# Patient Record
Sex: Female | Born: 1995 | Race: Asian | Hispanic: No | Marital: Single | Smoking: Never smoker
Health system: Southern US, Community
[De-identification: ages and names within clinical notes are randomized; demographics above are authoritative.]

## PROBLEM LIST (undated history)

## (undated) DIAGNOSIS — R51 Headache: Secondary | ICD-10-CM

## (undated) DIAGNOSIS — IMO0002 Reserved for concepts with insufficient information to code with codable children: Secondary | ICD-10-CM

## (undated) DIAGNOSIS — R519 Headache, unspecified: Secondary | ICD-10-CM

## (undated) DIAGNOSIS — G43709 Chronic migraine without aura, not intractable, without status migrainosus: Secondary | ICD-10-CM

## (undated) DIAGNOSIS — J45909 Unspecified asthma, uncomplicated: Secondary | ICD-10-CM

## (undated) HISTORY — DX: Headache, unspecified: R51.9

## (undated) HISTORY — DX: Chronic migraine without aura, not intractable, without status migrainosus: G43.709

## (undated) HISTORY — DX: Unspecified asthma, uncomplicated: J45.909

## (undated) HISTORY — DX: Headache: R51

## (undated) HISTORY — DX: Reserved for concepts with insufficient information to code with codable children: IMO0002

## (undated) HISTORY — PX: NO PAST SURGERIES: SHX2092

---

## 2017-03-16 DIAGNOSIS — Z1389 Encounter for screening for other disorder: Secondary | ICD-10-CM | POA: Diagnosis not present

## 2017-03-16 DIAGNOSIS — R51 Headache: Secondary | ICD-10-CM | POA: Diagnosis not present

## 2017-03-16 DIAGNOSIS — Z020 Encounter for examination for admission to educational institution: Secondary | ICD-10-CM | POA: Diagnosis not present

## 2017-03-16 DIAGNOSIS — Z79899 Other long term (current) drug therapy: Secondary | ICD-10-CM | POA: Diagnosis not present

## 2018-01-13 DIAGNOSIS — R55 Syncope and collapse: Secondary | ICD-10-CM | POA: Diagnosis not present

## 2018-01-13 DIAGNOSIS — Z6826 Body mass index (BMI) 26.0-26.9, adult: Secondary | ICD-10-CM | POA: Diagnosis not present

## 2018-01-13 DIAGNOSIS — R42 Dizziness and giddiness: Secondary | ICD-10-CM | POA: Diagnosis not present

## 2018-01-13 DIAGNOSIS — G43109 Migraine with aura, not intractable, without status migrainosus: Secondary | ICD-10-CM | POA: Diagnosis not present

## 2018-01-13 DIAGNOSIS — R799 Abnormal finding of blood chemistry, unspecified: Secondary | ICD-10-CM | POA: Diagnosis not present

## 2018-01-14 ENCOUNTER — Other Ambulatory Visit: Payer: Self-pay | Admitting: Family

## 2018-01-14 DIAGNOSIS — R55 Syncope and collapse: Secondary | ICD-10-CM

## 2018-01-14 DIAGNOSIS — R42 Dizziness and giddiness: Secondary | ICD-10-CM

## 2018-01-14 DIAGNOSIS — G43109 Migraine with aura, not intractable, without status migrainosus: Secondary | ICD-10-CM

## 2018-01-15 ENCOUNTER — Ambulatory Visit
Admission: RE | Admit: 2018-01-15 | Discharge: 2018-01-15 | Disposition: A | Discharge status: Home / Self Care | Payer: BLUE CROSS/BLUE SHIELD | Source: Ambulatory Visit | Attending: Family | Admitting: Family

## 2018-01-15 DIAGNOSIS — R55 Syncope and collapse: Secondary | ICD-10-CM

## 2018-01-15 DIAGNOSIS — G43109 Migraine with aura, not intractable, without status migrainosus: Secondary | ICD-10-CM

## 2018-01-15 DIAGNOSIS — R42 Dizziness and giddiness: Secondary | ICD-10-CM | POA: Diagnosis not present

## 2018-01-22 DIAGNOSIS — R7989 Other specified abnormal findings of blood chemistry: Secondary | ICD-10-CM | POA: Diagnosis not present

## 2018-02-02 DIAGNOSIS — R5383 Other fatigue: Secondary | ICD-10-CM | POA: Diagnosis not present

## 2018-02-02 DIAGNOSIS — Z6825 Body mass index (BMI) 25.0-25.9, adult: Secondary | ICD-10-CM | POA: Diagnosis not present

## 2018-02-02 DIAGNOSIS — R531 Weakness: Secondary | ICD-10-CM | POA: Diagnosis not present

## 2018-02-02 DIAGNOSIS — G4452 New daily persistent headache (NDPH): Secondary | ICD-10-CM | POA: Diagnosis not present

## 2018-02-03 DIAGNOSIS — R5383 Other fatigue: Secondary | ICD-10-CM | POA: Diagnosis not present

## 2018-02-11 DIAGNOSIS — G43709 Chronic migraine without aura, not intractable, without status migrainosus: Secondary | ICD-10-CM | POA: Diagnosis not present

## 2018-02-11 DIAGNOSIS — Z049 Encounter for examination and observation for unspecified reason: Secondary | ICD-10-CM | POA: Diagnosis not present

## 2018-02-19 DIAGNOSIS — M791 Myalgia, unspecified site: Secondary | ICD-10-CM | POA: Diagnosis not present

## 2018-02-19 DIAGNOSIS — G518 Other disorders of facial nerve: Secondary | ICD-10-CM | POA: Diagnosis not present

## 2018-02-19 DIAGNOSIS — M542 Cervicalgia: Secondary | ICD-10-CM | POA: Diagnosis not present

## 2018-02-19 DIAGNOSIS — G43709 Chronic migraine without aura, not intractable, without status migrainosus: Secondary | ICD-10-CM | POA: Diagnosis not present

## 2018-03-19 DIAGNOSIS — G43709 Chronic migraine without aura, not intractable, without status migrainosus: Secondary | ICD-10-CM | POA: Diagnosis not present

## 2018-03-19 DIAGNOSIS — M542 Cervicalgia: Secondary | ICD-10-CM | POA: Diagnosis not present

## 2018-03-19 DIAGNOSIS — G518 Other disorders of facial nerve: Secondary | ICD-10-CM | POA: Diagnosis not present

## 2018-03-19 DIAGNOSIS — M791 Myalgia, unspecified site: Secondary | ICD-10-CM | POA: Diagnosis not present

## 2018-03-31 DIAGNOSIS — M542 Cervicalgia: Secondary | ICD-10-CM | POA: Diagnosis not present

## 2018-03-31 DIAGNOSIS — M791 Myalgia, unspecified site: Secondary | ICD-10-CM | POA: Diagnosis not present

## 2018-03-31 DIAGNOSIS — G518 Other disorders of facial nerve: Secondary | ICD-10-CM | POA: Diagnosis not present

## 2018-03-31 DIAGNOSIS — G43709 Chronic migraine without aura, not intractable, without status migrainosus: Secondary | ICD-10-CM | POA: Diagnosis not present

## 2018-04-08 DIAGNOSIS — J4520 Mild intermittent asthma, uncomplicated: Secondary | ICD-10-CM | POA: Diagnosis not present

## 2018-04-08 DIAGNOSIS — Z6824 Body mass index (BMI) 24.0-24.9, adult: Secondary | ICD-10-CM | POA: Diagnosis not present

## 2018-04-08 DIAGNOSIS — J00 Acute nasopharyngitis [common cold]: Secondary | ICD-10-CM | POA: Diagnosis not present

## 2018-04-08 DIAGNOSIS — G43109 Migraine with aura, not intractable, without status migrainosus: Secondary | ICD-10-CM | POA: Diagnosis not present

## 2018-04-14 DIAGNOSIS — G518 Other disorders of facial nerve: Secondary | ICD-10-CM | POA: Diagnosis not present

## 2018-04-14 DIAGNOSIS — G43709 Chronic migraine without aura, not intractable, without status migrainosus: Secondary | ICD-10-CM | POA: Diagnosis not present

## 2018-04-14 DIAGNOSIS — M791 Myalgia, unspecified site: Secondary | ICD-10-CM | POA: Diagnosis not present

## 2018-04-14 DIAGNOSIS — M542 Cervicalgia: Secondary | ICD-10-CM | POA: Diagnosis not present

## 2018-04-28 DIAGNOSIS — G43709 Chronic migraine without aura, not intractable, without status migrainosus: Secondary | ICD-10-CM | POA: Diagnosis not present

## 2018-04-28 DIAGNOSIS — M542 Cervicalgia: Secondary | ICD-10-CM | POA: Diagnosis not present

## 2018-04-28 DIAGNOSIS — G518 Other disorders of facial nerve: Secondary | ICD-10-CM | POA: Diagnosis not present

## 2018-04-28 DIAGNOSIS — M791 Myalgia, unspecified site: Secondary | ICD-10-CM | POA: Diagnosis not present

## 2018-06-10 DIAGNOSIS — G518 Other disorders of facial nerve: Secondary | ICD-10-CM | POA: Diagnosis not present

## 2018-06-10 DIAGNOSIS — M791 Myalgia, unspecified site: Secondary | ICD-10-CM | POA: Diagnosis not present

## 2018-06-10 DIAGNOSIS — M542 Cervicalgia: Secondary | ICD-10-CM | POA: Diagnosis not present

## 2018-06-10 DIAGNOSIS — G43709 Chronic migraine without aura, not intractable, without status migrainosus: Secondary | ICD-10-CM | POA: Diagnosis not present

## 2018-06-11 ENCOUNTER — Telehealth: Payer: Self-pay

## 2018-06-11 NOTE — Telephone Encounter (Signed)
SENT REFERRAL TO SCHEDULING AND FILED NOTES 

## 2018-06-22 ENCOUNTER — Encounter: Payer: Self-pay | Admitting: Internal Medicine

## 2018-07-08 ENCOUNTER — Institutional Professional Consult (permissible substitution): Payer: BLUE CROSS/BLUE SHIELD | Admitting: Internal Medicine

## 2018-07-22 DIAGNOSIS — G43709 Chronic migraine without aura, not intractable, without status migrainosus: Secondary | ICD-10-CM | POA: Diagnosis not present

## 2018-07-22 DIAGNOSIS — M542 Cervicalgia: Secondary | ICD-10-CM | POA: Diagnosis not present

## 2018-07-22 DIAGNOSIS — M791 Myalgia, unspecified site: Secondary | ICD-10-CM | POA: Diagnosis not present

## 2018-07-22 DIAGNOSIS — G518 Other disorders of facial nerve: Secondary | ICD-10-CM | POA: Diagnosis not present

## 2018-08-03 ENCOUNTER — Encounter (INDEPENDENT_AMBULATORY_CARE_PROVIDER_SITE_OTHER): Payer: Self-pay

## 2018-08-03 ENCOUNTER — Encounter: Payer: Self-pay | Admitting: Cardiology

## 2018-08-03 ENCOUNTER — Ambulatory Visit (INDEPENDENT_AMBULATORY_CARE_PROVIDER_SITE_OTHER): Payer: BLUE CROSS/BLUE SHIELD | Admitting: Cardiology

## 2018-08-03 VITALS — BP 112/62 | HR 71 | Ht 59.0 in | Wt 123.0 lb

## 2018-08-03 DIAGNOSIS — I951 Orthostatic hypotension: Secondary | ICD-10-CM

## 2018-08-03 NOTE — Patient Instructions (Signed)
Medication Instructions:  Your physician recommends that you continue on your current medications as directed. Please refer to the Current Medication list given to you today.  * If you need a refill on your cardiac medications before your next appointment, please call your pharmacy.   Labwork: None ordered  Testing/Procedures: None ordered  Follow-Up: Your physician recommends that you schedule a follow-up appointment in: 3 months with Dr. Elberta Fortis.   Thank you for choosing [facility]!!   Dory Horn, RN [phone]  Any Other Special Instructions Will Be Listed Below (If Applicable).   Increase your fluid intake to 2 Liters a day Increase your salt intake to 3-5 grams of salt per day (you can buy salt tablets)

## 2018-08-03 NOTE — Progress Notes (Signed)
Electrophysiology Office Note   Date:  08/03/2018   ID:  Janet Dawson, DOB 1996-03-20, MRN [id number]  PCP:  Shirlee Latch, NP  Cardiologist:   Primary Electrophysiologist:  Kaydense Rizo Jorja Loa, MD    No chief complaint on file.    History of Present Illness: Janet Dawson is a 23 y.o. female who is being seen today for the evaluation of dizziness at the request of Shirlee Latch, NP. Presenting today for electrophysiology evaluation.  He has a history of headaches.  She has been on multiple medication regimens for her headaches.  She comes in today because of episodes of dizziness.  She does get dizzy when she stands up at times.  She does not have feelings of palpitations.  She also gets dizzy when she raises her hands above her head.  This happens approximately once a week.  This occurs when she is for example putting her hair up.  She gets dizzy as well as mildly short of breath.  She has to sit down at times when this occurs.    Today, she denies symptoms of palpitations, chest pain, shortness of breath, orthopnea, PND, lower extremity edema, claudication,  presyncope, syncope, bleeding, or neurologic sequela. The patient is tolerating medications without difficulties.    Past Medical History:  Diagnosis Date  . Asthma   . Chronic migraine   . Headache    Past Surgical History:  Procedure Laterality Date  . NO PAST SURGERIES       Current Outpatient Medications  Medication Sig Dispense Refill  . baclofen (LIORESAL) 10 MG tablet Take 10 mg by mouth as needed (once weekly).     Marland Kitchen zonisamide (ZONEGRAN) 100 MG capsule Take 200 mg by mouth daily.     No current facility-administered medications for this visit.     Allergies:   Patient has no known allergies.   Social History:  The patient  reports that she has never smoked. She has never used smokeless tobacco. She reports that she does not drink alcohol or use drugs.   Family History:  The patient's family history  includes Cancer in an other family member; Headache in an other family member.    ROS:  Please see the history of present illness.   Otherwise, review of systems is positive for change, shortness of breath, nausea, dizziness, headaches.   All other systems are reviewed and negative.    PHYSICAL EXAM: VS:  BP 112/62 (BP Location: Right Arm, Cuff Size: Normal)   Pulse 71   Ht 4\' 11"  (1.499 m)   Wt 123 lb (55.8 kg)   SpO2 99%   BMI 24.84 kg/m  , BMI Body mass index is 24.84 kg/m. GEN: Well nourished, well developed, in no acute distress  HEENT: normal  Neck: no JVD, carotid bruits, or masses Cardiac: RRR; no murmurs, rubs, or gallops,no edema  Respiratory:  clear to auscultation bilaterally, normal work of breathing GI: soft, nontender, nondistended, + BS MS: no deformity or atrophy  Skin: warm and dry Neuro:  Strength and sensation are intact Psych: euthymic mood, full affect  EKG:  EKG is ordered today. Personal review of the ekg ordered shows sinus rhythm, rate 69  Orthostatic VS for the past 24 hrs:  BP- Lying Pulse- Lying BP- Sitting Pulse- Sitting BP- Standing at 0 minutes Pulse- Standing at 0 minutes  08/03/18 0905 112/62 71 106/58 74 100/60 92    Recent Labs: No results found for requested labs within last 8760  hours.    Lipid Panel  No results found for: CHOL, TRIG, HDL, CHOLHDL, VLDL, LDLCALC, LDLDIRECT   Wt Readings from Last 3 Encounters:  08/03/18 123 lb (55.8 kg)      Other studies Reviewed: Additional studies/ records that were reviewed today include: outside records   ASSESSMENT AND PLAN:  1.  Mild orthostatic hypotension: Found today during exam.  She does get dizzy when she stands up as well and she raises her hands above her head at times.  I have told her to increase her fluids as well as increase her salt intake.  She did not get tachycardic to the point of this being pots.  I Satoshi Kalas see her back in a few months for further  evaluation.    Current medicines are reviewed at length with the patient today.   The patient does not have concerns regarding her medicines.  The following changes were made today:  none  Labs/ tests ordered today include:  Orders Placed This Encounter  Procedures  . EKG 12-Lead     Disposition:   FU with Cesilia Shinn 3 months  Signed, Shalika Arntz Jorja Loa, MD  08/03/2018 9:24 AM     [facility] [address] [place] [address] [phone] (office) [phone] (fax)

## 2018-09-02 DIAGNOSIS — M791 Myalgia, unspecified site: Secondary | ICD-10-CM | POA: Diagnosis not present

## 2018-09-02 DIAGNOSIS — M542 Cervicalgia: Secondary | ICD-10-CM | POA: Diagnosis not present

## 2018-09-02 DIAGNOSIS — G518 Other disorders of facial nerve: Secondary | ICD-10-CM | POA: Diagnosis not present

## 2018-09-02 DIAGNOSIS — G43709 Chronic migraine without aura, not intractable, without status migrainosus: Secondary | ICD-10-CM | POA: Diagnosis not present

## 2018-10-08 DIAGNOSIS — G43109 Migraine with aura, not intractable, without status migrainosus: Secondary | ICD-10-CM | POA: Diagnosis not present

## 2018-10-08 DIAGNOSIS — J4520 Mild intermittent asthma, uncomplicated: Secondary | ICD-10-CM | POA: Diagnosis not present

## 2018-10-08 DIAGNOSIS — Z6823 Body mass index (BMI) 23.0-23.9, adult: Secondary | ICD-10-CM | POA: Diagnosis not present

## 2018-10-14 DIAGNOSIS — G43709 Chronic migraine without aura, not intractable, without status migrainosus: Secondary | ICD-10-CM | POA: Diagnosis not present

## 2018-10-14 DIAGNOSIS — M542 Cervicalgia: Secondary | ICD-10-CM | POA: Diagnosis not present

## 2018-11-02 ENCOUNTER — Telehealth: Payer: Self-pay | Admitting: *Deleted

## 2018-11-02 NOTE — Telephone Encounter (Signed)
Confirmed Appt with patient.      COVID-19 Pre-Screening Questions:  . In the past 7 to 10 days have you had a cough,  shortness of breath, headache, congestion, fever (100 or greater) body aches, chills, sore throat, or sudden loss of taste or sense of smell?  no . Have you been around anyone with known Covid 19.  no . Have you been around anyone who is awaiting Covid 19 test results in the past 7 to 10 days?  no . Have you been around anyone who has been exposed to Covid 19, or has mentioned symptoms of Covid 19 within the past 7 to 10 days?  no  If you have any concerns/questions about symptoms patients report during screening (either on the phone or at threshold). Contact the provider seeing the patient or DOD for further guidance.  If neither are available contact a member of the leadership team.

## 2018-11-09 ENCOUNTER — Ambulatory Visit (INDEPENDENT_AMBULATORY_CARE_PROVIDER_SITE_OTHER): Payer: BC Managed Care – PPO | Admitting: Cardiology

## 2018-11-09 ENCOUNTER — Encounter: Payer: Self-pay | Admitting: Cardiology

## 2018-11-09 ENCOUNTER — Other Ambulatory Visit: Payer: Self-pay

## 2018-11-09 VITALS — BP 98/60 | HR 84 | Ht 59.0 in | Wt 120.0 lb

## 2018-11-09 DIAGNOSIS — I951 Orthostatic hypotension: Secondary | ICD-10-CM

## 2018-11-09 NOTE — Progress Notes (Signed)
Electrophysiology Office Note   Date:  11/09/2018   ID:  Janet GasterCharity Dawson, DOB 1995-06-28, MRN [id number]  PCP:  Shirlee LatchHeyn, Sharon Np, NP  Cardiologist:   Primary Electrophysiologist:  Graciella FreerMichael Andrew Tillery, PA-C    CC: Dizziness   History of Present Illness: Janet Dawson is a 23 y.o. female who is being seen today for the evaluation of dizziness at the request of Shirlee LatchHeyn, Sharon Np, NP. Presenting today for electrophysiology evaluation.  He has a history of headaches.  She has been on multiple medication regimens for her headaches.  She comes in today for follow up for dizziness.    She has overall been feeling better since her last visit. Since starting salt tablets, Her symptoms have improved from several times a day, to ~ 3 times a week. She denies symptoms of palpitations, chest pain, orthopnea, PND, edema, presyncope, or syncope. She occasionally has SOB during her episodes. They occur most often when she is standing for prolonged periods, reaches over her head for extended periods, or has an empty stomach.   Past Medical History:  Diagnosis Date  . Asthma   . Chronic migraine   . Headache    Past Surgical History:  Procedure Laterality Date  . NO PAST SURGERIES       Current Outpatient Medications  Medication Sig Dispense Refill  . baclofen (LIORESAL) 10 MG tablet Take 10 mg by mouth once a week.     . zonisamide (ZONEGRAN) 100 MG capsule Take 200 mg by mouth daily.     No current facility-administered medications for this visit.    Allergies:   Patient has no known allergies.   Social History:  The patient  reports that she has never smoked. She has never used smokeless tobacco. She reports that she does not drink alcohol or use drugs.   Family History:  The patient's family history includes Cancer in an other family member; Headache in an other family member.   Review of systems complete and found to be negative unless listed in HPI.   Physical Exam: Vitals:   11/09/18  0856  BP: 98/60  Pulse: 84  Weight: 120 lb (54.4 kg)  Height: 4\' 11"  (1.499 m)   GEN- The patient is well appearing, alert and oriented x 3 today.   Head- normocephalic, atraumatic Eyes-  Sclera clear, conjunctiva pink Ears- hearing intact Oropharynx- clear Neck- supple,  Lungs- Clear to ausculation bilaterally, normal work of breathing Heart- Regular rate and rhythm, no murmurs, rubs or gallops, PMI not laterally displaced GI- soft, NT, ND, + BS Extremities- no clubbing, cyanosis, or edema Neuro- strength and sensation are intact   EKG:  EKG is not ordered today. Personal review of previous EKG showed NSR, 69 bpm.   No data found.  Recent Labs: No results found for requested labs within last 8760 hours.    Lipid Panel  No results found for: CHOL, TRIG, HDL, CHOLHDL, VLDL, LDLCALC, LDLDIRECT   Wt Readings from Last 3 Encounters:  08/03/18 123 lb (55.8 kg)      Other studies Reviewed: Additional studies/ records that were reviewed today include: outside records   ASSESSMENT AND PLAN:  1.  Mild orthostatic hypotension: No orthostatics today. Her symptoms have much improved with an intentional diet and hydration with as needed salt tablets. We have likely improved her symptoms as much as we can at this time. No tachycardia to suggest POTS.    Current medicines are reviewed at length with the patient today.  The patient does not have concerns regarding her medicines.  The following changes were made today:  none  Labs/ tests ordered today include:  No orders of the defined types were placed in this encounter.  Disposition:   FU with Chrishawn Boley 6 months  Signed, Annamaria Helling  11/09/2018 8:57 AM     [facility] [address] [place] [address] [phone] (office) [phone] (fax)  I have seen and examined this patient with Oda Kilts.  Agree with above, note added to reflect my findings.  On exam, RRR, no  murmurs, lungs clear.  Patient taking salt tabs for orthostatic hypotension.  This is improved her episodes of dizziness.  She is still having episodic weakness, though I do not think that this is cardiac related.  No changes at this time.  Anabell Swint M. Reakwon Barren MD 11/09/2018 9:22 AM

## 2018-11-09 NOTE — Patient Instructions (Signed)
Medication Instructions:  Your physician recommends that you continue on your current medications as directed. Please refer to the Current Medication list given to you today.  If you need a refill on your cardiac medications before your next appointment, please call your pharmacy.   Labwork: None ordered  Testing/Procedures: None ordered  Follow-Up: Your physician wants you to follow-up in: 6 months with Dr. Camnitz.  You will receive a reminder letter in the mail two months in advance. If you don't receive a letter, please call our office to schedule the follow-up appointment.  Thank you for choosing [facility]!!   Mackena Plummer, RN [phone]         

## 2018-12-10 DIAGNOSIS — G43709 Chronic migraine without aura, not intractable, without status migrainosus: Secondary | ICD-10-CM | POA: Diagnosis not present

## 2018-12-10 DIAGNOSIS — M542 Cervicalgia: Secondary | ICD-10-CM | POA: Diagnosis not present

## 2019-01-20 DIAGNOSIS — Z832 Family history of diseases of the blood and blood-forming organs and certain disorders involving the immune mechanism: Secondary | ICD-10-CM | POA: Diagnosis not present

## 2019-01-20 DIAGNOSIS — Z6824 Body mass index (BMI) 24.0-24.9, adult: Secondary | ICD-10-CM | POA: Diagnosis not present

## 2019-01-20 DIAGNOSIS — R5383 Other fatigue: Secondary | ICD-10-CM | POA: Diagnosis not present

## 2019-01-20 DIAGNOSIS — R0602 Shortness of breath: Secondary | ICD-10-CM | POA: Diagnosis not present

## 2019-01-21 DIAGNOSIS — G43709 Chronic migraine without aura, not intractable, without status migrainosus: Secondary | ICD-10-CM | POA: Diagnosis not present

## 2019-01-21 DIAGNOSIS — M542 Cervicalgia: Secondary | ICD-10-CM | POA: Diagnosis not present

## 2019-02-03 DIAGNOSIS — Z832 Family history of diseases of the blood and blood-forming organs and certain disorders involving the immune mechanism: Secondary | ICD-10-CM | POA: Diagnosis not present

## 2019-02-03 DIAGNOSIS — R718 Other abnormality of red blood cells: Secondary | ICD-10-CM | POA: Diagnosis not present

## 2019-04-15 DIAGNOSIS — G43709 Chronic migraine without aura, not intractable, without status migrainosus: Secondary | ICD-10-CM | POA: Diagnosis not present

## 2019-04-15 DIAGNOSIS — M542 Cervicalgia: Secondary | ICD-10-CM | POA: Diagnosis not present

## 2019-06-30 DIAGNOSIS — Z1152 Encounter for screening for COVID-19: Secondary | ICD-10-CM | POA: Diagnosis not present

## 2019-07-07 DIAGNOSIS — Z20828 Contact with and (suspected) exposure to other viral communicable diseases: Secondary | ICD-10-CM | POA: Diagnosis not present

## 2019-07-07 DIAGNOSIS — Z20822 Contact with and (suspected) exposure to covid-19: Secondary | ICD-10-CM | POA: Diagnosis not present

## 2019-07-13 DIAGNOSIS — R079 Chest pain, unspecified: Secondary | ICD-10-CM | POA: Diagnosis not present

## 2019-07-13 DIAGNOSIS — Z03818 Encounter for observation for suspected exposure to other biological agents ruled out: Secondary | ICD-10-CM | POA: Diagnosis not present

## 2019-07-13 DIAGNOSIS — R0602 Shortness of breath: Secondary | ICD-10-CM | POA: Diagnosis not present

## 2019-07-13 DIAGNOSIS — R05 Cough: Secondary | ICD-10-CM | POA: Diagnosis not present

## 2019-08-18 DIAGNOSIS — H00016 Hordeolum externum left eye, unspecified eyelid: Secondary | ICD-10-CM | POA: Diagnosis not present

## 2019-11-14 IMAGING — CT CT HEAD W/O CM
3 of 4 series · 16 of 47 positions shown, 19 images · non-contrast
Comparison: None.

CLINICAL DATA: Syncope and dizziness with headaches for several
months

EXAM:
CT HEAD WITHOUT CONTRAST
TECHNIQUE: Contiguous axial images were obtained from the base of the skull
through the vertex without intravenous contrast.

[Series 2: head 5.00 hr40 s3 ibhc · axial · 0.41mm/px · z∈[-428,-298]mm · 10 of 32 slices shown, 13 images]
[im 3/32  brain]
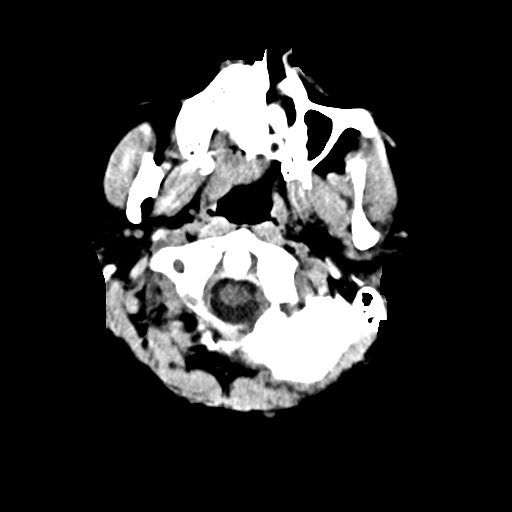
[im 3/32  bone]
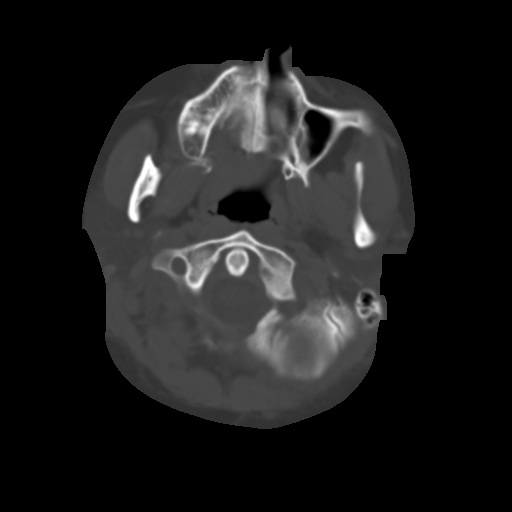
[im 5/32  brain]
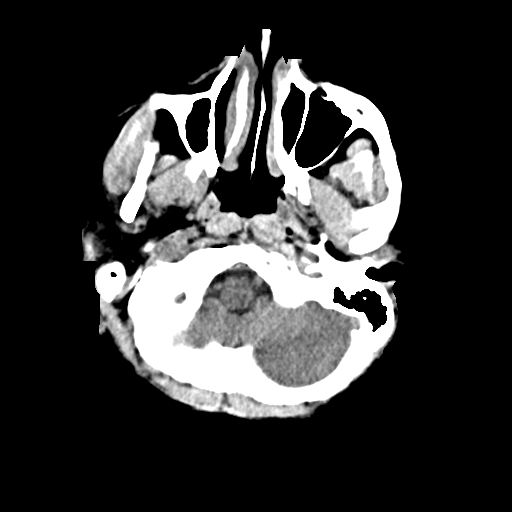
[im 9/32  brain]
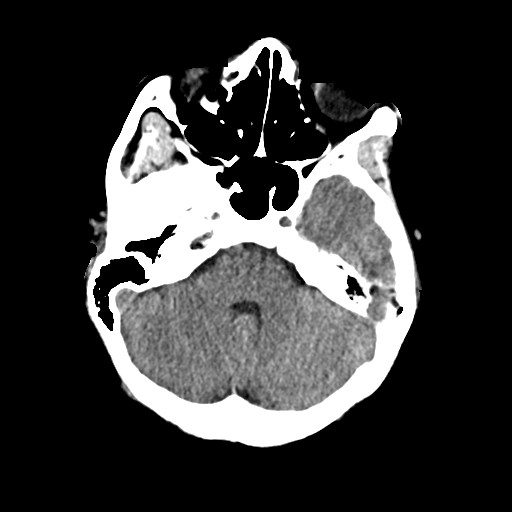
[im 12/32  brain]
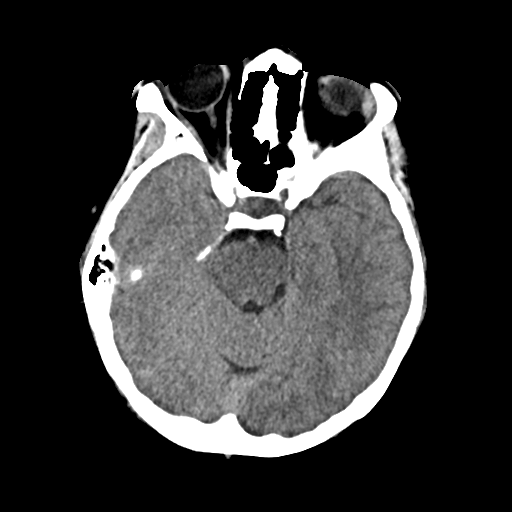
[im 14/32  brain]
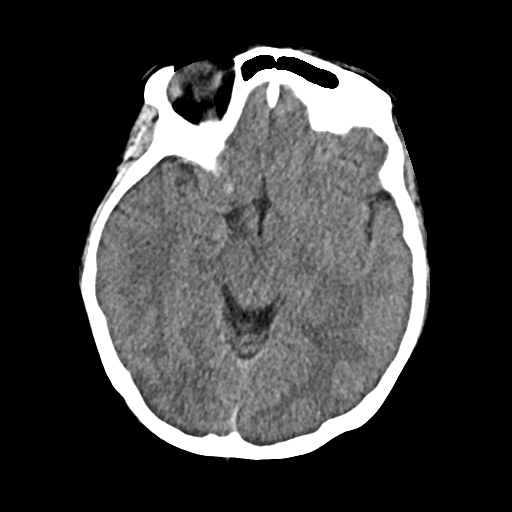
[im 14/32  bone]
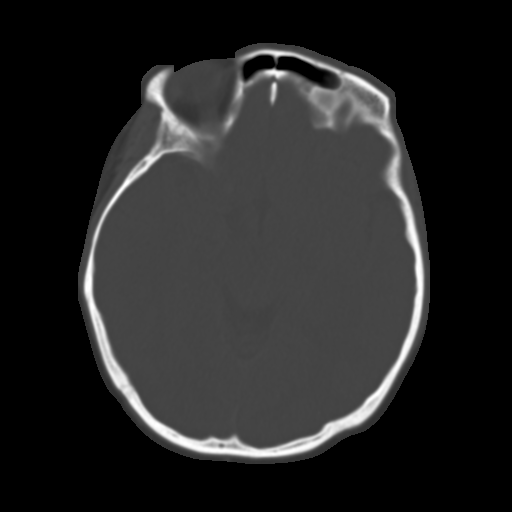
[im 18/32  brain]
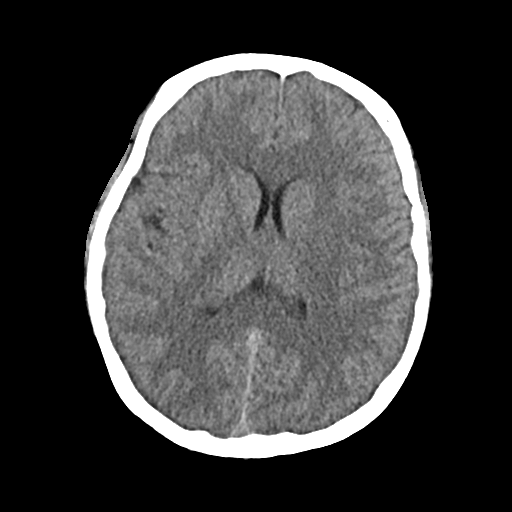
[im 20/32  brain]
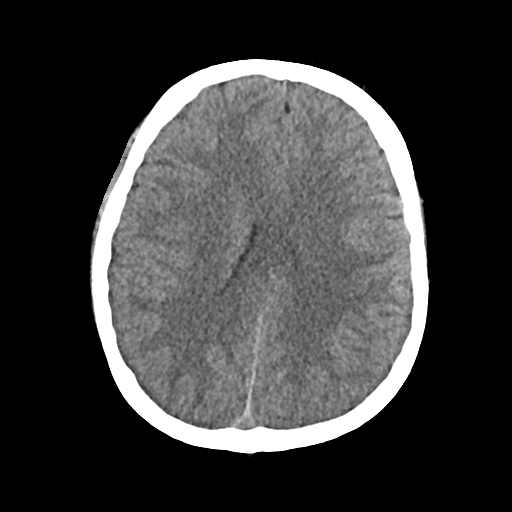
[im 23/32  brain]
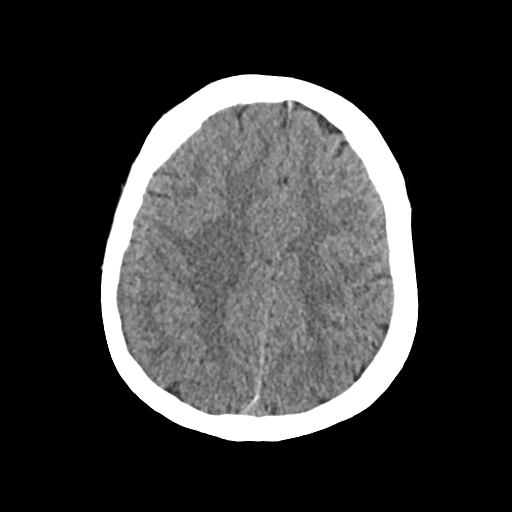
[im 27/32  brain]
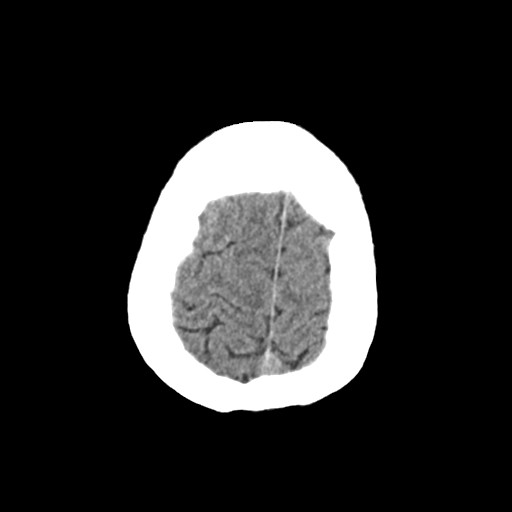
[im 27/32  bone]
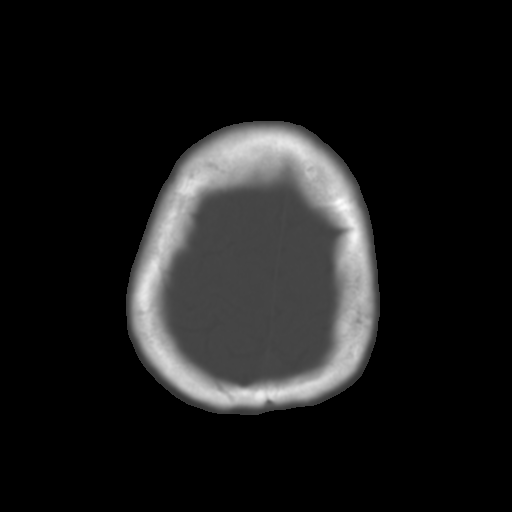
[im 29/32  brain]
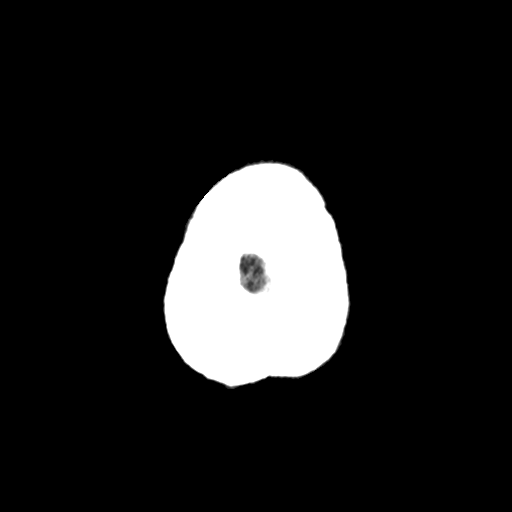

[Series 4: head 3.00 hr40 s3 sag · sagittal · 0.32mm/px · 3 of 57 slices shown]
[im 19/57  brain]
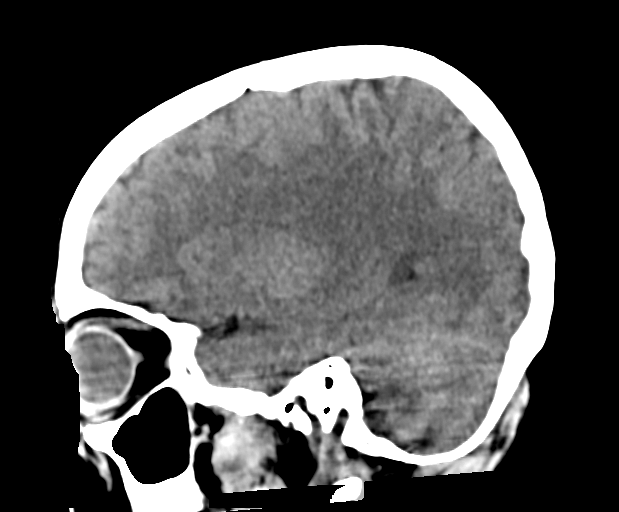
[im 29/57  brain]
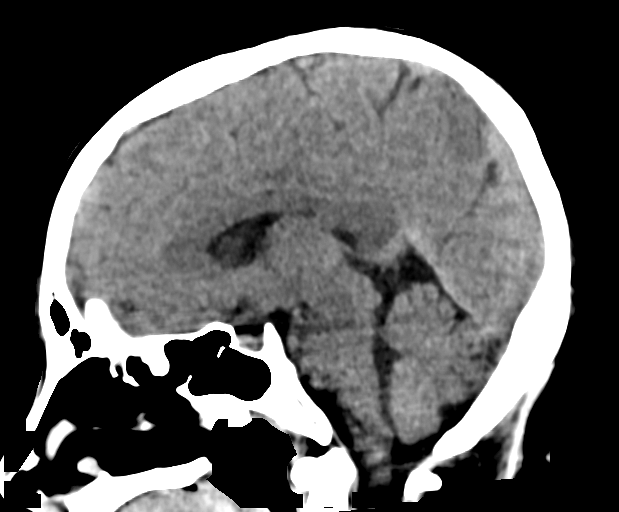
[im 38/57  brain]
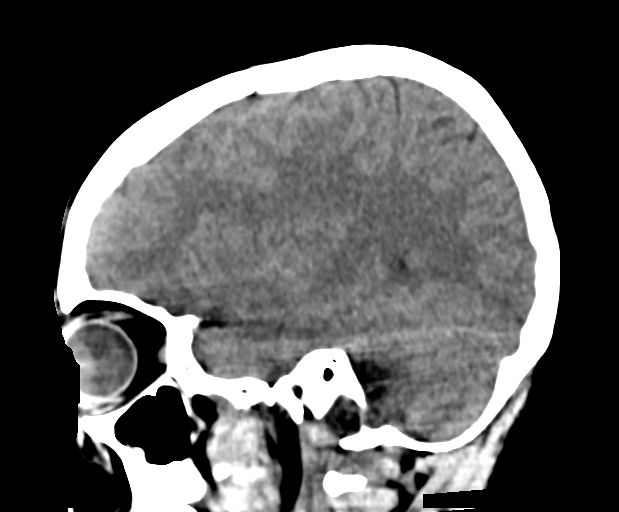

[Series 6: head 3.00 hr40 s3 cor · coronal · 0.31mm/px · 3 of 65 slices shown]
[im 22/65  brain]
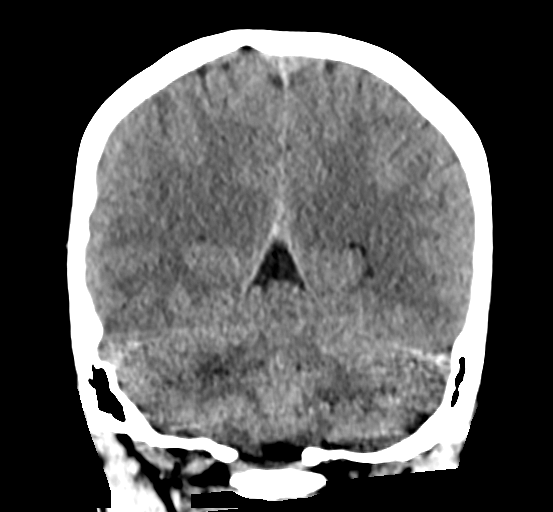
[im 29/65  brain]
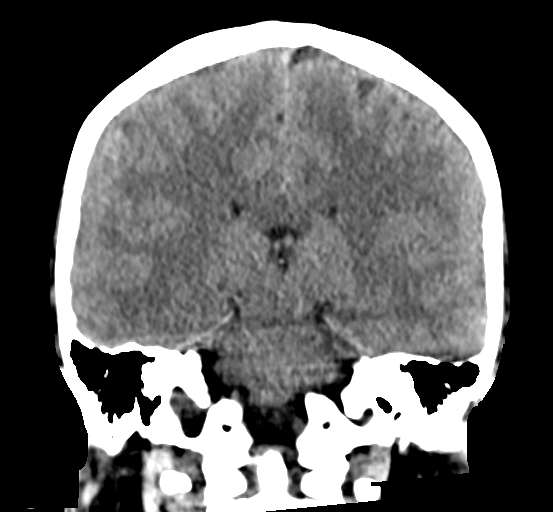
[im 36/65  brain]
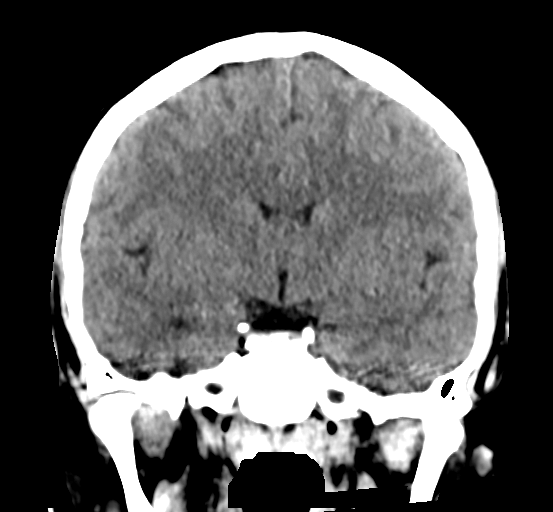

[16 of 47 positions shown; findings below may reference images not displayed]

FINDINGS: Brain: The ventricles are normal in size and configuration. There is
no intracranial mass, hemorrhage, extra-axial fluid collection, or
midline shift. Gray-white compartments appear normal. No evident
acute infarct.

Vascular: No hyperdense vessel.  No vascular calcification evident.

Skull: The bony calvarium appears intact.

Sinuses/Orbits: There is a small retention cyst in the anterior
inferior right maxillary antrum. Other paranasal sinuses are clear.
Orbits appear symmetric bilaterally.

Other: Mastoid air cells are clear.
IMPRESSION: Small retention cyst in the anterior inferior right maxillary
antrum. Study otherwise unremarkable.
